# Patient Record
Sex: Male | Born: 1970 | Hispanic: No | Marital: Married | Smoking: Never smoker
Health system: Southern US, Community
[De-identification: ages and names within clinical notes are randomized; demographics above are authoritative.]

## PROBLEM LIST (undated history)

## (undated) DIAGNOSIS — L409 Psoriasis, unspecified: Secondary | ICD-10-CM

## (undated) HISTORY — PX: ABDOMINAL SURGERY: SHX537

## (undated) HISTORY — DX: Psoriasis, unspecified: L40.9

---

## 1998-05-07 ENCOUNTER — Emergency Department (HOSPITAL_COMMUNITY): Admission: EM | Admit: 1998-05-07 | Discharge: 1998-05-07 | Payer: Self-pay | Admitting: Emergency Medicine

## 1999-10-02 ENCOUNTER — Encounter: Payer: Self-pay | Admitting: Emergency Medicine

## 1999-10-02 ENCOUNTER — Emergency Department (HOSPITAL_COMMUNITY): Admission: EM | Admit: 1999-10-02 | Discharge: 1999-10-02 | Payer: Self-pay

## 1999-10-30 ENCOUNTER — Emergency Department (HOSPITAL_COMMUNITY): Admission: EM | Admit: 1999-10-30 | Discharge: 1999-10-31 | Payer: Self-pay | Admitting: Emergency Medicine

## 2009-06-01 ENCOUNTER — Encounter: Admission: RE | Admit: 2009-06-01 | Discharge: 2009-06-01 | Payer: Self-pay | Admitting: Internal Medicine

## 2010-02-22 ENCOUNTER — Emergency Department (HOSPITAL_BASED_OUTPATIENT_CLINIC_OR_DEPARTMENT_OTHER)
Admission: EM | Admit: 2010-02-22 | Discharge: 2010-02-22 | Payer: Self-pay | Source: Home / Self Care | Admitting: Emergency Medicine

## 2010-05-10 LAB — DIFFERENTIAL
Basophils Absolute: 0 10*3/uL (ref 0.0–0.1)
Basophils Relative: 0 % (ref 0–1)
Eosinophils Absolute: 0.1 10*3/uL (ref 0.0–0.7)
Eosinophils Relative: 0 % (ref 0–5)
Lymphocytes Relative: 4 % — ABNORMAL LOW (ref 12–46)
Lymphs Abs: 0.6 10*3/uL — ABNORMAL LOW (ref 0.7–4.0)
Monocytes Absolute: 1 10*3/uL (ref 0.1–1.0)
Monocytes Relative: 6 % (ref 3–12)
Neutro Abs: 14.6 10*3/uL — ABNORMAL HIGH (ref 1.7–7.7)
Neutrophils Relative %: 90 % — ABNORMAL HIGH (ref 43–77)

## 2010-05-10 LAB — CBC
HCT: 44.7 % (ref 39.0–52.0)
Hemoglobin: 16 g/dL (ref 13.0–17.0)
MCH: 30.6 pg (ref 26.0–34.0)
MCHC: 35.8 g/dL (ref 30.0–36.0)
MCV: 85.5 fL (ref 78.0–100.0)
Platelets: 208 10*3/uL (ref 150–400)
RBC: 5.23 MIL/uL (ref 4.22–5.81)
RDW: 12.1 % (ref 11.5–15.5)
WBC: 16.2 10*3/uL — ABNORMAL HIGH (ref 4.0–10.5)

## 2010-05-10 LAB — COMPREHENSIVE METABOLIC PANEL
ALT: 40 U/L (ref 0–53)
AST: 40 U/L — ABNORMAL HIGH (ref 0–37)
Albumin: 4.7 g/dL (ref 3.5–5.2)
Alkaline Phosphatase: 105 U/L (ref 39–117)
BUN: 23 mg/dL (ref 6–23)
CO2: 23 mEq/L (ref 19–32)
Calcium: 9.3 mg/dL (ref 8.4–10.5)
Chloride: 106 mEq/L (ref 96–112)
Creatinine, Ser: 0.8 mg/dL (ref 0.4–1.5)
GFR calc Af Amer: >60 mL/min (ref >60)
GFR calc non Af Amer: >60 mL/min (ref >60)
Glucose, Bld: 118 mg/dL — ABNORMAL HIGH (ref 70–99)
Potassium: 3.7 mEq/L (ref 3.5–5.1)
Sodium: 144 mEq/L (ref 135–145)
Total Bilirubin: 1.1 mg/dL (ref 0.3–1.2)
Total Protein: 8.4 g/dL — ABNORMAL HIGH (ref 6.0–8.3)

## 2011-08-09 IMAGING — US US ABDOMEN COMPLETE
1 series · 14 of 25 positions shown · non-contrast
Comparison: None.

CLINICAL DATA: Abnormal LFTs.

ABDOMINAL ULTRASOUND COMPLETE

[Series 1: us abdomen complete · 0.24mm/px · 14 of 71 slices shown]
[im 1/71]
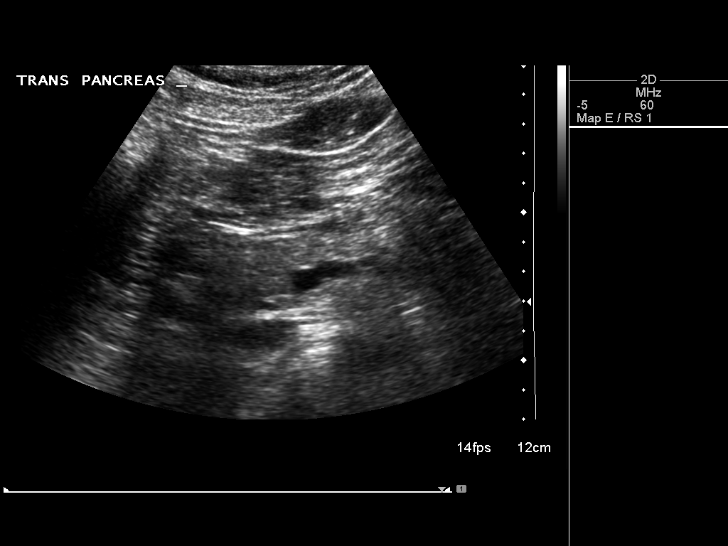
[im 6/71]
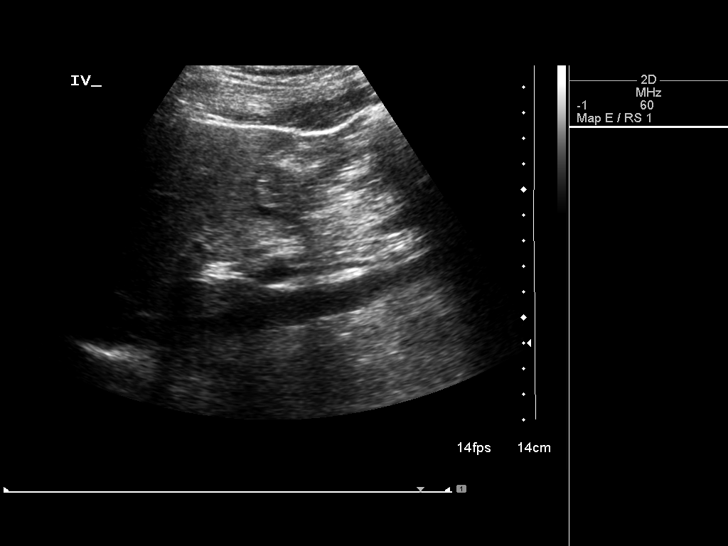
[im 12/71]
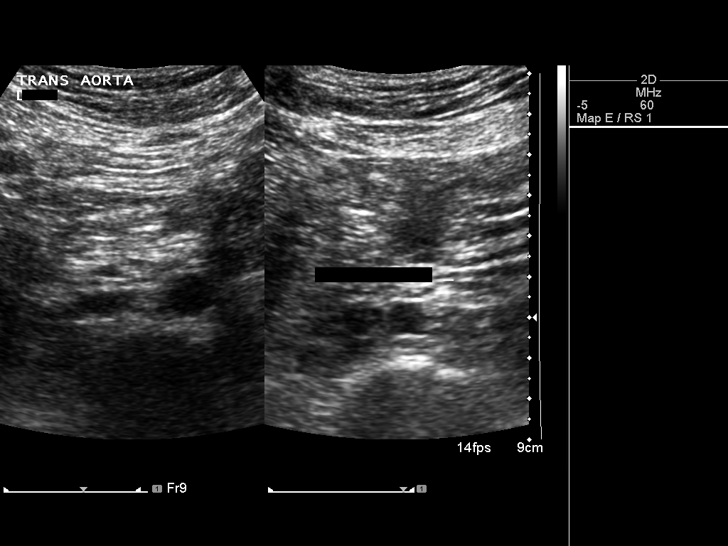
[im 18/71]
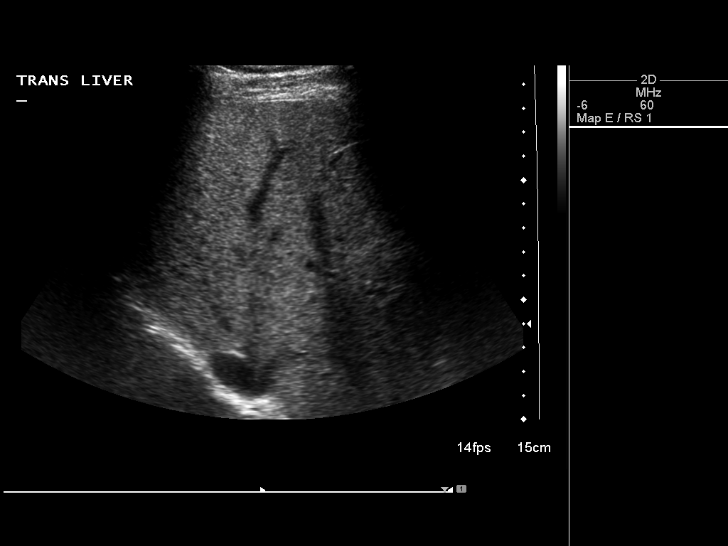
[im 24/71]
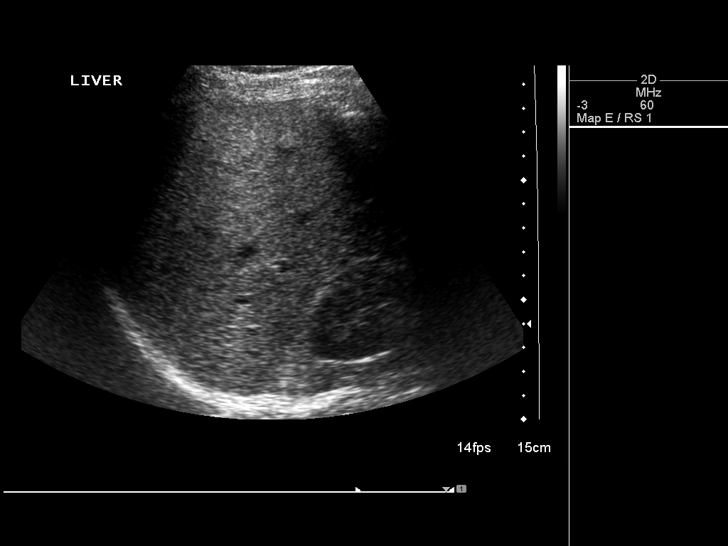
[im 27/71]
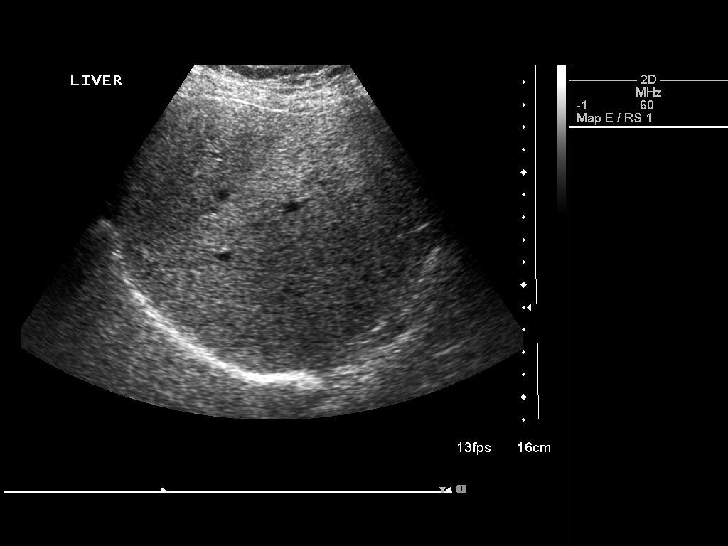
[im 33/71]
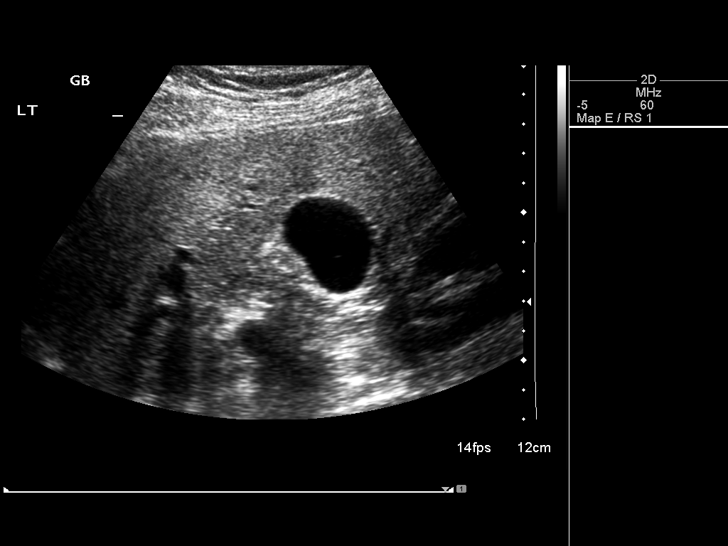
[im 38/71]
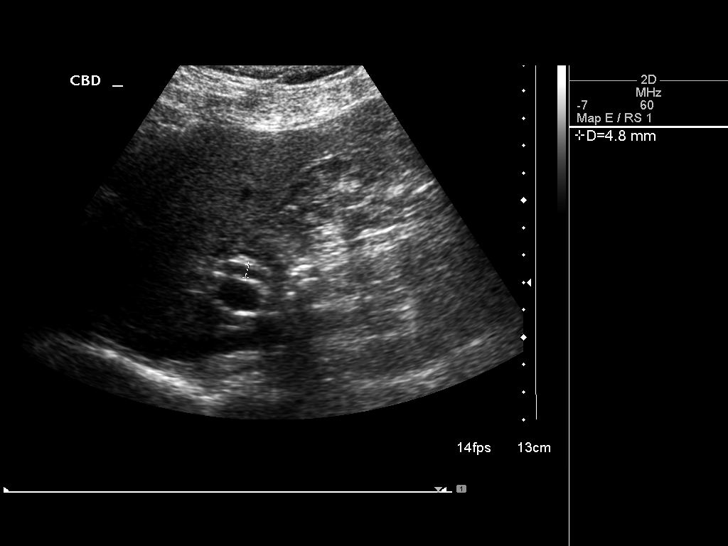
[im 44/71]
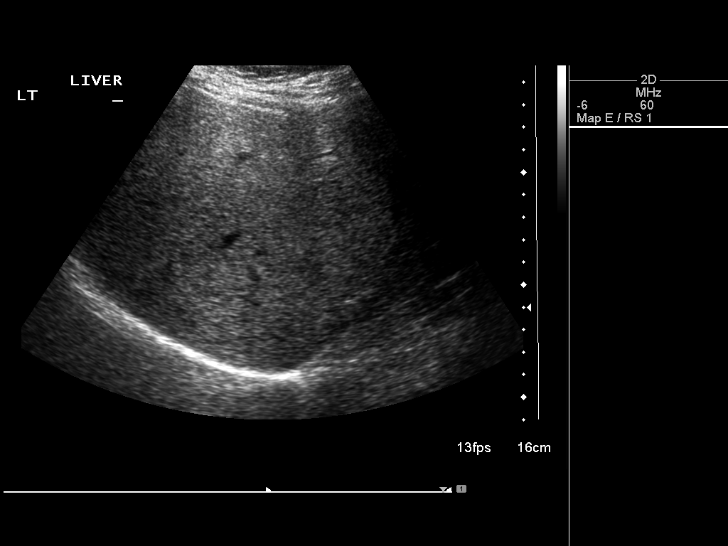
[im 47/71]
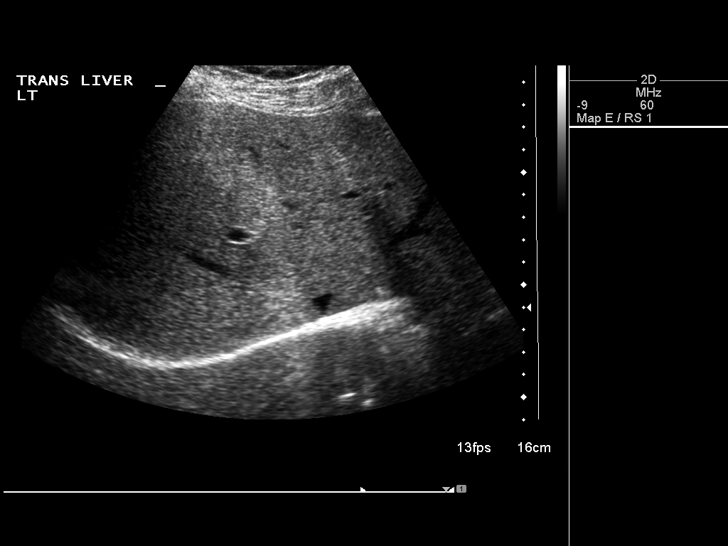
[im 53/71]
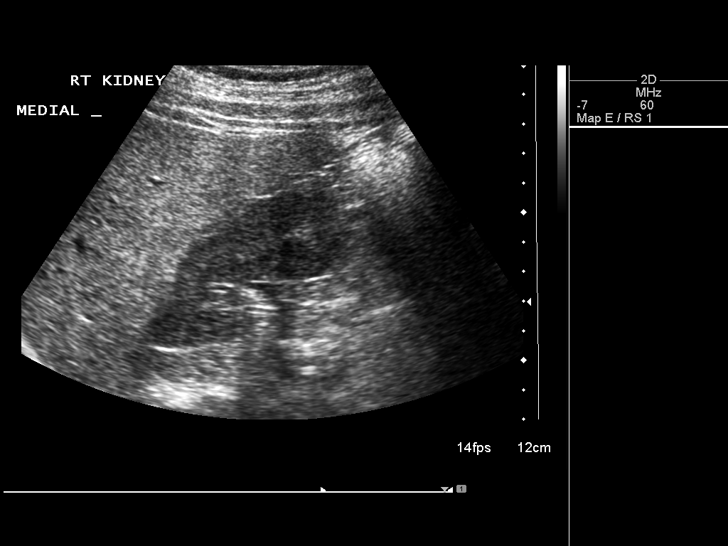
[im 59/71]
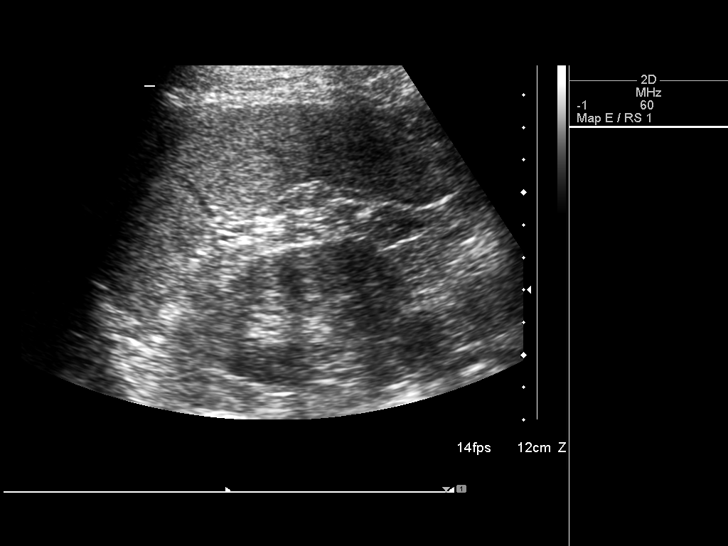
[im 65/71]
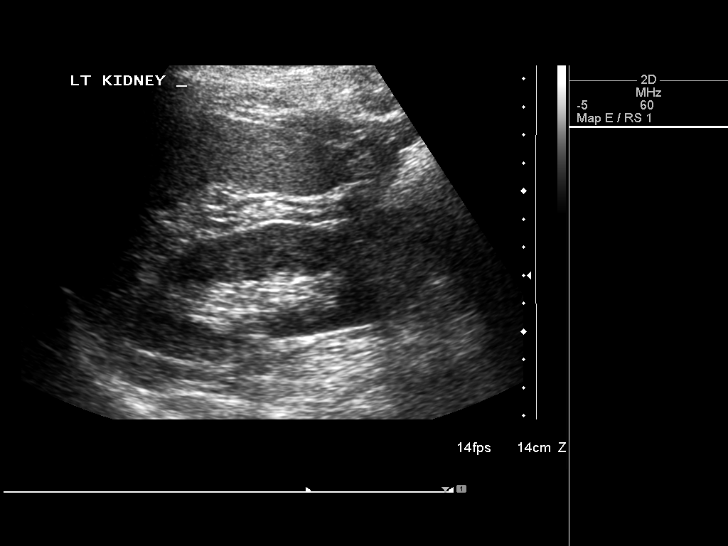
[im 71/71]
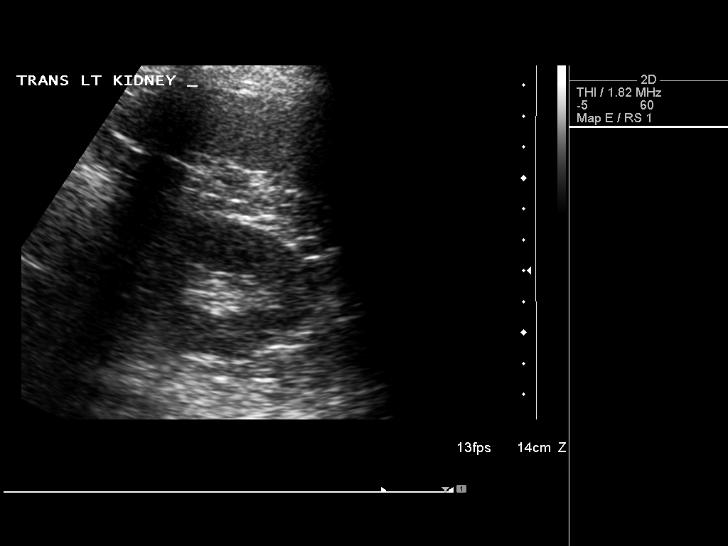

[14 of 25 positions shown; findings below may reference images not displayed]

FINDINGS: Gallbladder:  No gallstones, gallbladder wall thickening, or
pericholecystic fluid.

Common Bile Duct:  Within normal limits in caliber. Common duct
diameter is 4.8 mm.

Liver:  No focal mass lesion identified.  Subtle heterogeneity of
the hepatic echotexture may be associated with diffuse
hepatocellular disease.

IVC:  Appears normal.

Pancreas:  Limited visualization of the pancreas due to overlying
bowel gas.

Spleen:  Within normal limits in size and echotexture. 10.0 cm
splenic length.

Right kidney:  Normal in size (10.2 cm length) and parenchymal
echogenicity.  No evidence of mass or hydronephrosis.

Left kidney:  Normal in size (10.3 cm length) and parenchymal
echogenicity.  No evidence of mass or hydronephrosis.

Abdominal Aorta:  No aneurysm identified. 1.9 cm maximum diameter.
IMPRESSION: Normal gallbladder.  No biliary ductal dilatation.  Suspicion for
mild diffuse hepatocellular disease.

## 2012-05-01 IMAGING — CR DG ABDOMEN ACUTE W/ 1V CHEST
3 series · 3 of 3 positions shown · non-contrast
Comparison: None.

CLINICAL DATA: Abdominal pain.

ACUTE ABDOMEN SERIES (ABDOMEN 2 VIEW & CHEST 1 VIEW)

[w chest pa]
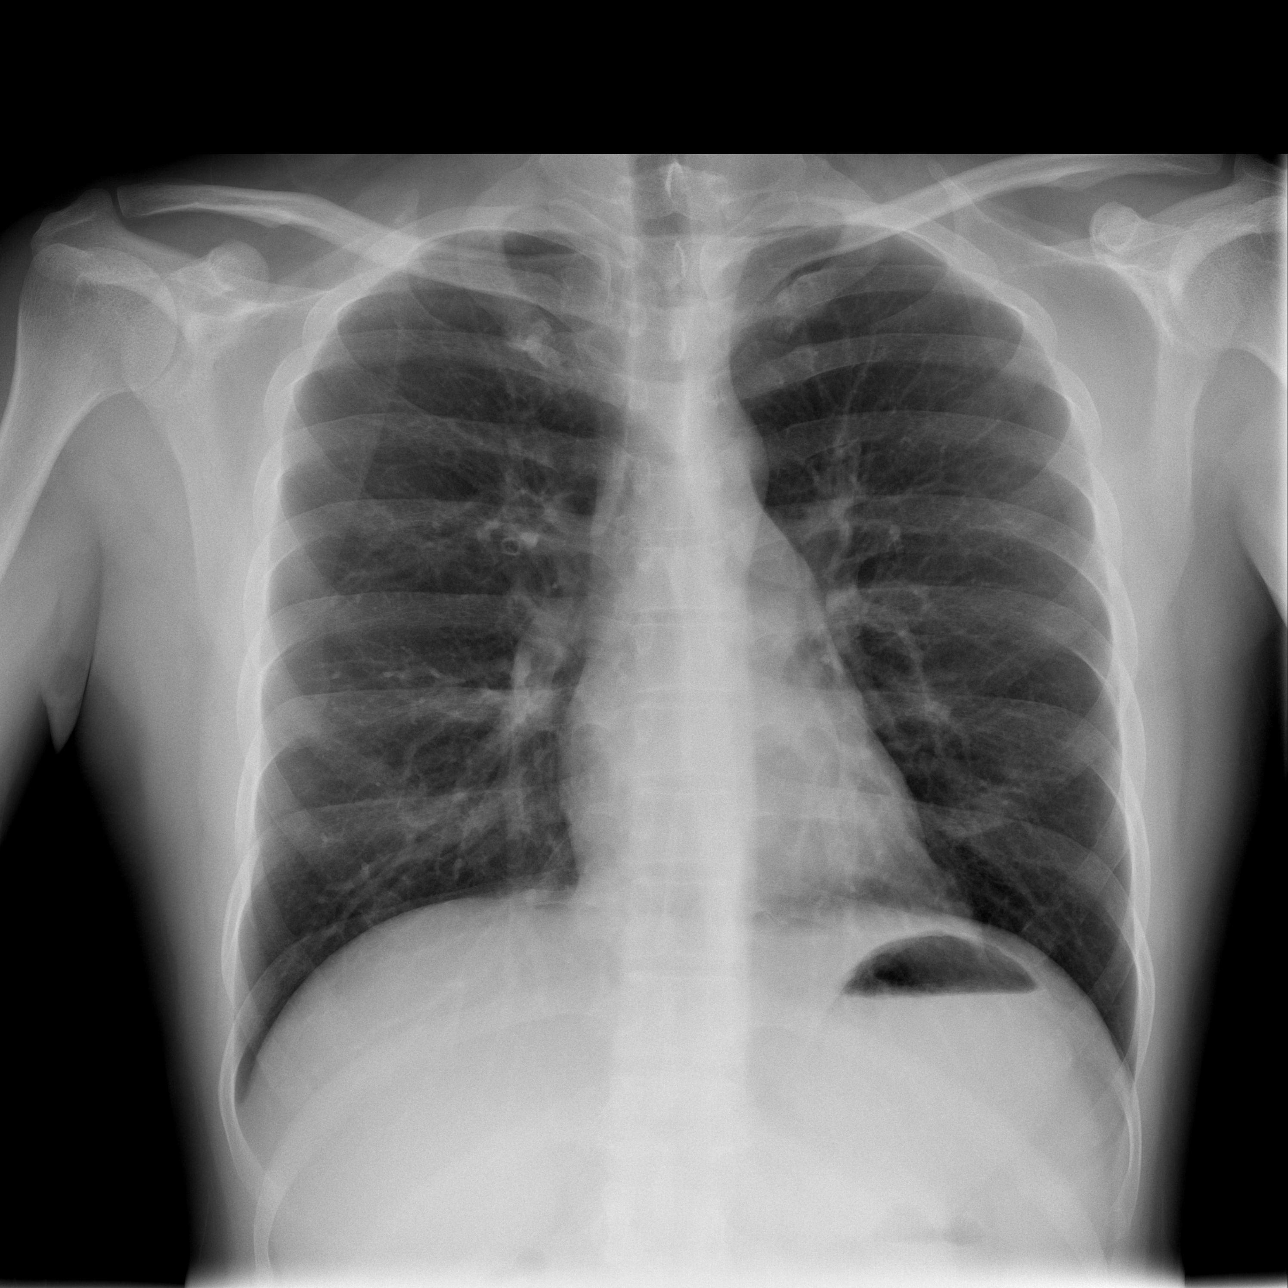

[w abdomen upright]
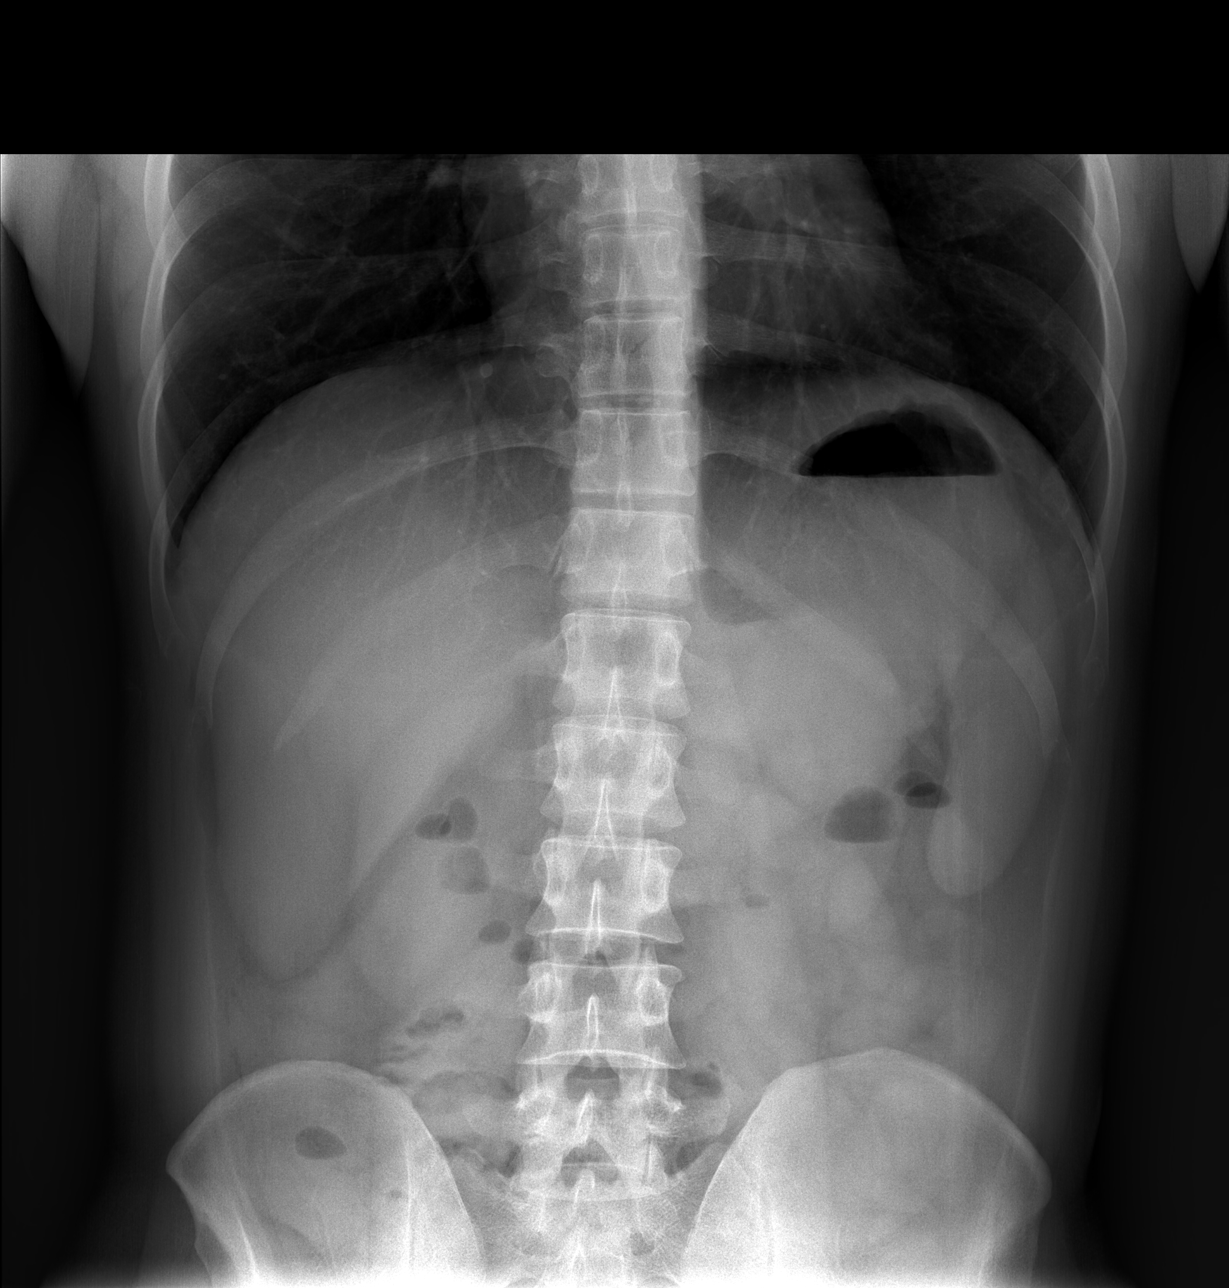

[t abdomen supine]
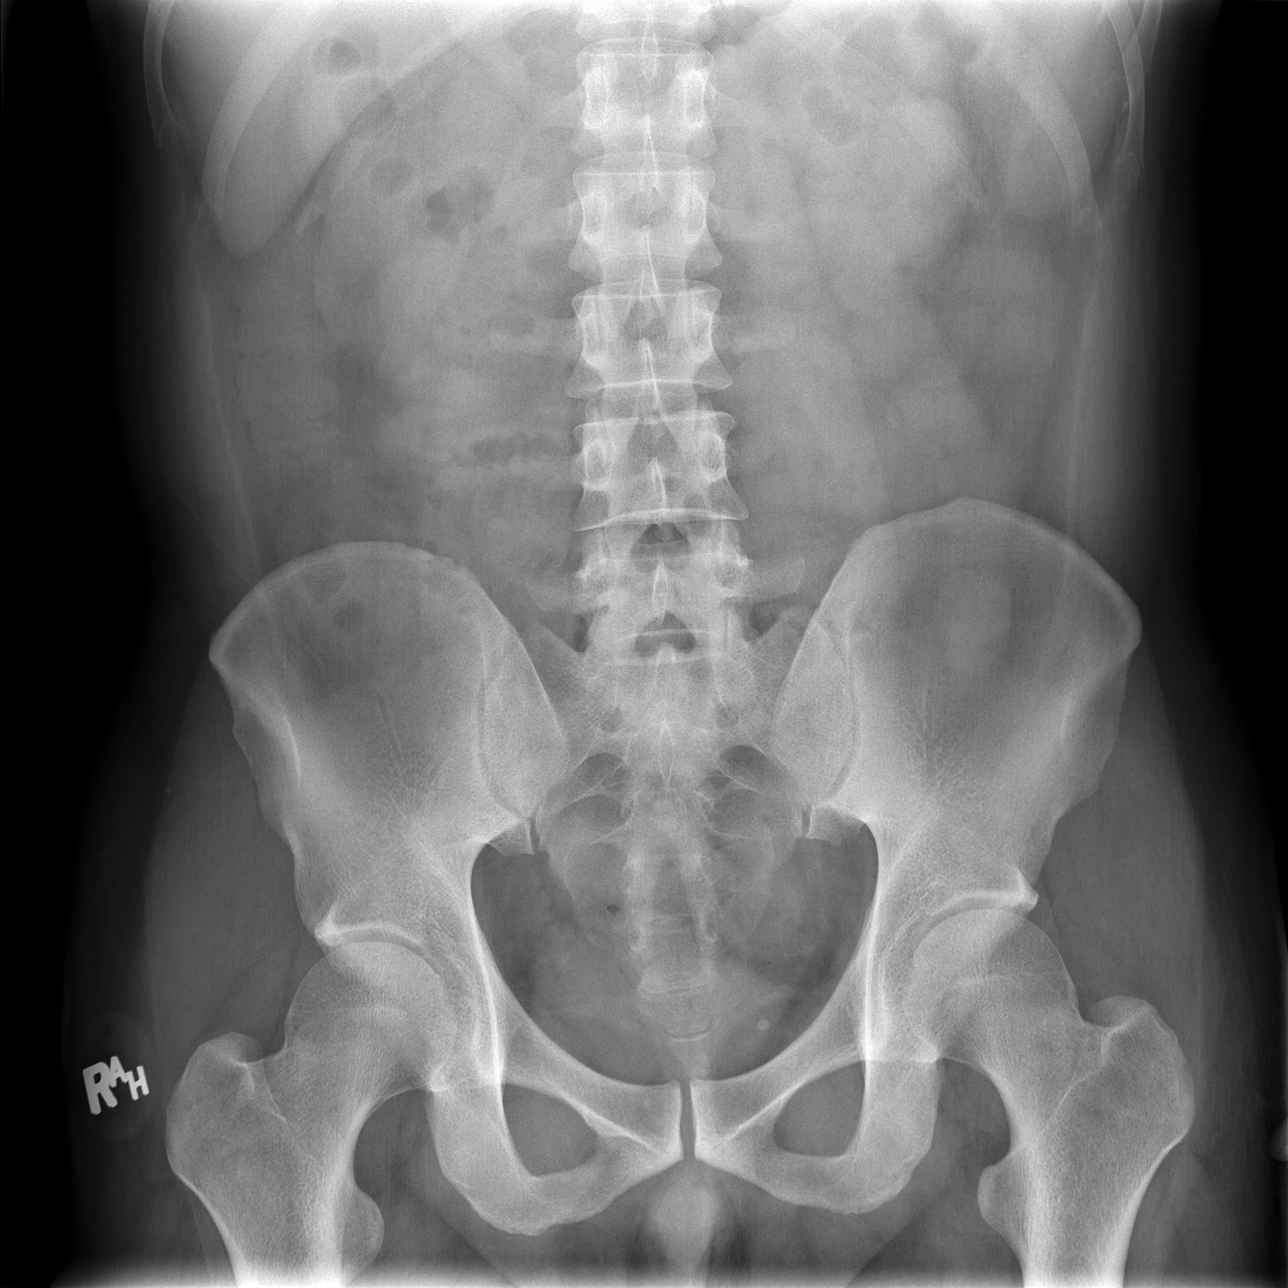

[3 of 3 positions shown; findings below may reference images not displayed]

FINDINGS: Single view of the chest demonstrates clear lungs and
normal heart size.  No pneumothorax or pleural effusion.

Two views of the abdomen show no free intraperitoneal air.  Short
air fluid levels are noted in the colon.  No small bowel
distention.
IMPRESSION: Short air fluid levels in the colon compatible with the presence of
liquid stool.  Negative for bowel obstruction.

## 2016-02-29 DIAGNOSIS — Z0000 Encounter for general adult medical examination without abnormal findings: Secondary | ICD-10-CM | POA: Diagnosis not present

## 2016-03-11 DIAGNOSIS — S63521A Sprain of radiocarpal joint of right wrist, initial encounter: Secondary | ICD-10-CM | POA: Diagnosis not present

## 2016-03-29 DIAGNOSIS — R05 Cough: Secondary | ICD-10-CM | POA: Diagnosis not present

## 2016-03-29 DIAGNOSIS — J069 Acute upper respiratory infection, unspecified: Secondary | ICD-10-CM | POA: Diagnosis not present

## 2016-04-27 DIAGNOSIS — E039 Hypothyroidism, unspecified: Secondary | ICD-10-CM | POA: Diagnosis not present

## 2016-04-27 DIAGNOSIS — Z0000 Encounter for general adult medical examination without abnormal findings: Secondary | ICD-10-CM | POA: Diagnosis not present

## 2016-04-27 DIAGNOSIS — L409 Psoriasis, unspecified: Secondary | ICD-10-CM | POA: Diagnosis not present

## 2016-04-27 DIAGNOSIS — Z131 Encounter for screening for diabetes mellitus: Secondary | ICD-10-CM | POA: Diagnosis not present

## 2016-05-04 DIAGNOSIS — L409 Psoriasis, unspecified: Secondary | ICD-10-CM | POA: Diagnosis not present

## 2016-05-04 DIAGNOSIS — K760 Fatty (change of) liver, not elsewhere classified: Secondary | ICD-10-CM | POA: Diagnosis not present

## 2016-05-04 DIAGNOSIS — Z0000 Encounter for general adult medical examination without abnormal findings: Secondary | ICD-10-CM | POA: Diagnosis not present

## 2016-07-06 DIAGNOSIS — J069 Acute upper respiratory infection, unspecified: Secondary | ICD-10-CM | POA: Diagnosis not present

## 2016-11-09 DIAGNOSIS — K760 Fatty (change of) liver, not elsewhere classified: Secondary | ICD-10-CM | POA: Diagnosis not present

## 2016-11-09 DIAGNOSIS — E039 Hypothyroidism, unspecified: Secondary | ICD-10-CM | POA: Diagnosis not present

## 2016-11-29 DIAGNOSIS — Z23 Encounter for immunization: Secondary | ICD-10-CM | POA: Diagnosis not present

## 2017-01-04 DIAGNOSIS — S2231XA Fracture of one rib, right side, initial encounter for closed fracture: Secondary | ICD-10-CM | POA: Diagnosis not present

## 2017-01-04 DIAGNOSIS — R0781 Pleurodynia: Secondary | ICD-10-CM | POA: Diagnosis not present

## 2017-02-15 DIAGNOSIS — R0781 Pleurodynia: Secondary | ICD-10-CM | POA: Diagnosis not present

## 2017-02-17 DIAGNOSIS — H43393 Other vitreous opacities, bilateral: Secondary | ICD-10-CM | POA: Diagnosis not present

## 2017-02-17 DIAGNOSIS — H2513 Age-related nuclear cataract, bilateral: Secondary | ICD-10-CM | POA: Diagnosis not present

## 2017-05-01 ENCOUNTER — Encounter: Payer: Self-pay | Admitting: Rheumatology

## 2017-05-01 DIAGNOSIS — K760 Fatty (change of) liver, not elsewhere classified: Secondary | ICD-10-CM | POA: Diagnosis not present

## 2017-05-01 DIAGNOSIS — E039 Hypothyroidism, unspecified: Secondary | ICD-10-CM | POA: Diagnosis not present

## 2017-05-01 DIAGNOSIS — Z0000 Encounter for general adult medical examination without abnormal findings: Secondary | ICD-10-CM | POA: Diagnosis not present

## 2017-05-08 DIAGNOSIS — Z0000 Encounter for general adult medical examination without abnormal findings: Secondary | ICD-10-CM | POA: Diagnosis not present

## 2017-05-08 DIAGNOSIS — Z125 Encounter for screening for malignant neoplasm of prostate: Secondary | ICD-10-CM | POA: Diagnosis not present

## 2017-05-08 DIAGNOSIS — K760 Fatty (change of) liver, not elsewhere classified: Secondary | ICD-10-CM | POA: Diagnosis not present

## 2017-05-08 DIAGNOSIS — L409 Psoriasis, unspecified: Secondary | ICD-10-CM | POA: Diagnosis not present

## 2017-06-21 DIAGNOSIS — R05 Cough: Secondary | ICD-10-CM | POA: Diagnosis not present

## 2017-06-21 DIAGNOSIS — J302 Other seasonal allergic rhinitis: Secondary | ICD-10-CM | POA: Diagnosis not present

## 2017-10-09 DIAGNOSIS — S161XXA Strain of muscle, fascia and tendon at neck level, initial encounter: Secondary | ICD-10-CM | POA: Diagnosis not present

## 2018-01-29 NOTE — Progress Notes (Signed)
Office Visit Note  Patient: Edward Fitzgerald             Date of Birth: 1970/04/07           MRN: [id number]             PCP: Merrilee Seashore, MD Referring: Merrilee Seashore, MD Visit Date: 02/08/2018 Occupation: Corporate investment banker  Subjective:  Psorisis.   History of Present Illness: Edward Fitzgerald is a 47 y.o. male seen in consultation per request of his PCP.  According to patient he started having psoriasis about 10 years ago.  The psoriasis gradually spread over the last for 5 years.  He states he is seen dermatologist who offered only topical agents and none of those were helpful.  He was also advised some subcu medication and he did not like the side effects.  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  He does not have any joint pain or joint swelling.  He denies any history of uveitis, plantar fasciitis or Achilles tendinitis.  He has been using Taclonex which has not been helpful.  Activities of Daily Living:  Patient reports morning stiffness for 0 minute.   Patient Denies nocturnal pain.  Difficulty dressing/grooming: Denies Difficulty climbing stairs: Denies Difficulty getting out of chair: Denies Difficulty using hands for taps, buttons, cutlery, and/or writing: Denies  Review of Systems  Constitutional: Negative for fatigue and night sweats.  HENT: Negative for mouth sores, mouth dryness and nose dryness.   Eyes: Negative for redness and dryness.  Respiratory: Negative for shortness of breath and difficulty breathing.   Cardiovascular: Negative for chest pain, palpitations, hypertension, irregular heartbeat and swelling in legs/feet.  Gastrointestinal: Negative for constipation and diarrhea.  Endocrine: Negative for increased urination.  Musculoskeletal: Negative for arthralgias, joint pain, joint swelling, myalgias, muscle weakness, morning stiffness, muscle tenderness and myalgias.  Skin: Positive for rash. Negative for color change, hair loss, nodules/bumps,  skin tightness, ulcers and sensitivity to sunlight.  Allergic/Immunologic: Negative for susceptible to infections.  Neurological: Negative for dizziness, fainting, memory loss, night sweats and weakness ( ).  Hematological: Negative for swollen glands.  Psychiatric/Behavioral: Negative for depressed mood and sleep disturbance. The patient is nervous/anxious.     PMFS History:  Patient Active Problem List   Diagnosis Date Noted  . Subclinical hypothyroidism 02/08/2018  . NAFLD (nonalcoholic fatty liver disease) 02/08/2018  . Psoriasis 02/08/2018    History reviewed. No pertinent past medical history.  Family History  Problem Relation Age of Onset  . Healthy Mother   . Healthy Father   . Healthy Brother   . Healthy Son   . Healthy Daughter    Past Surgical History:  Procedure Laterality Date  . ABDOMINAL SURGERY     Social History   Social History Narrative  . Not on file    Objective: Vital Signs: BP 118/78 (BP Location: Right Arm, Patient Position: Sitting, Cuff Size: Normal)   Pulse 73   Resp 13   Ht '5\' 7"'$  (1.702 m)   Wt 162 lb 3.2 oz (73.6 kg)   BMI 25.40 kg/m    Physical Exam Vitals signs and nursing note reviewed.  Constitutional:      Appearance: He is well-developed.  HENT:     Head: Normocephalic and atraumatic.  Eyes:     Conjunctiva/sclera: Conjunctivae normal.     Pupils: Pupils are equal, round, and reactive to light.  Neck:     Musculoskeletal: Normal range of motion and neck supple.  Cardiovascular:     Rate and Rhythm: Normal rate and regular rhythm.     Heart sounds: Normal heart sounds.  Pulmonary:     Effort: Pulmonary effort is normal.     Breath sounds: Normal breath sounds.  Abdominal:     General: Bowel sounds are normal.     Palpations: Abdomen is soft.  Skin:    General: Skin is warm and dry.     Capillary Refill: Capillary refill takes less than 2 seconds.     Findings: Rash present.     Comments: Plaque psoriasis patches noted  on the scalp, trunk and extremities.  Neurological:     Mental Status: He is alert and oriented to person, place, and time.  Psychiatric:        Behavior: Behavior normal.      Musculoskeletal Exam: C-spine thoracic lumbar spine good range of motion.  Shoulder joints, elbow joints, wrist joints, MCPs and PIPs and DIPs were in good range of motion.  No synovitis was noted on examination.  Hip joints, knee joints, ankles, MTPs PIPs and DIPs with good range of motion with no synovitis.  CDAI Exam: CDAI Score: Not documented Patient Global Assessment: Not documented; Provider Global Assessment: Not documented Swollen: Not documented; Tender: Not documented Joint Exam   Not documented   There is currently no information documented on the homunculus. Go to the Rheumatology activity and complete the homunculus joint exam.  Investigation: No additional findings.  Imaging: No results found.  Recent Labs: Lab Results  Component Value Date   WBC 16.2 (H) 02/22/2010   HGB 16.0 02/22/2010   PLT 208 02/22/2010   NA 144 02/22/2010   K 3.7 02/22/2010   CL 106 02/22/2010   CO2 23 02/22/2010   GLUCOSE 118 (H) 02/22/2010   BUN 23 02/22/2010   CREATININE 0.8 02/22/2010   BILITOT 1.1 02/22/2010   ALKPHOS 105 02/22/2010   AST 40 (H) 02/22/2010   ALT 40 02/22/2010   PROT 8.4 (H) 02/22/2010   ALBUMIN 4.7 02/22/2010   CALCIUM 9.3 02/22/2010   GFRAA  02/22/2010    >60        The eGFR has been calculated using the MDRD equation. This calculation has not been validated in all clinical situations. eGFR's persistently <60 mL/min signify possible Chronic Kidney Disease.   May 01, 2017 CBC normal, CMP normal at his PCPs office Speciality Comments: No specialty comments available.  Procedures:  No procedures performed Allergies: Patient has no known allergies.   Assessment / Plan:     Visit Diagnoses: Plaque psoriasis-patient has extensive psoriasis covering almost all of his body.   He has psoriasis on his scalp, face, chest, trunk, extremities.  He has tried topical agents without good results.  He does not want to take methotrexate because of history of fatty liver and elevated LFTs in the past.  We had detailed discussion regarding different treatment options.  He is concerned about the subcu medications and their side effects.  We discussed possible use of Otezla.  He is more inclined towards going on [place].  Indications side effects were discussed.  We will apply for [facility].  Patient is concerned about the long-term side effects of all the medications and long-term use of medications.  We had detailed discussion about the medication use and the chronic nature of the disease process.  High risk prescription-I will obtain baseline labs today in case we have to start him on any other immunosuppressive agents.  I  will await approval from insurance company at this time.  Subclinical hypothyroidism  NAFLD (nonalcoholic fatty liver disease)   Orders: Orders Placed This Encounter  Procedures  . CBC with Differential/Platelet  . COMPLETE METABOLIC PANEL WITH GFR  . Hepatitis B core antibody, IgM  . Hepatitis B surface antigen  . Hepatitis C antibody  . HIV Antibody (routine testing w rflx)  . QuantiFERON-TB Gold Plus  . Serum protein electrophoresis with reflex  . IgG, IgA, IgM   No orders of the defined types were placed in this encounter.   Face-to-face time spent with patient was 40 minutes. Greater than 50% of time was spent in counseling and coordination of care.  Follow-Up Instructions: Return for Psoriasis.   Bo Merino, MD  Note - This record has been created using Editor, commissioning.  Chart creation errors have been sought, but may not always  have been located. Such creation errors do not reflect on  the standard of medical care.

## 2018-02-08 ENCOUNTER — Encounter: Payer: Self-pay | Admitting: Rheumatology

## 2018-02-08 ENCOUNTER — Telehealth: Payer: Self-pay | Admitting: Pharmacist

## 2018-02-08 ENCOUNTER — Ambulatory Visit (INDEPENDENT_AMBULATORY_CARE_PROVIDER_SITE_OTHER): Payer: 59 | Admitting: Rheumatology

## 2018-02-08 ENCOUNTER — Other Ambulatory Visit: Payer: Self-pay | Admitting: Rheumatology

## 2018-02-08 VITALS — BP 118/78 | HR 73 | Resp 13 | Ht 67.0 in | Wt 162.2 lb

## 2018-02-08 DIAGNOSIS — L409 Psoriasis, unspecified: Secondary | ICD-10-CM | POA: Insufficient documentation

## 2018-02-08 DIAGNOSIS — K760 Fatty (change of) liver, not elsewhere classified: Secondary | ICD-10-CM

## 2018-02-08 DIAGNOSIS — L400 Psoriasis vulgaris: Secondary | ICD-10-CM | POA: Diagnosis not present

## 2018-02-08 DIAGNOSIS — E038 Other specified hypothyroidism: Secondary | ICD-10-CM

## 2018-02-08 DIAGNOSIS — E039 Hypothyroidism, unspecified: Secondary | ICD-10-CM

## 2018-02-08 DIAGNOSIS — Z79899 Other long term (current) drug therapy: Secondary | ICD-10-CM | POA: Diagnosis not present

## 2018-02-08 NOTE — Progress Notes (Signed)
Pharmacy Note  Subjective:  Patient presents today to the [facility] to see Dr. Estanislado Pandy.  Patient was seen by the pharmacist for counseling on [place] for plaque psoriasis.  He is using topical therapy. He has fatty liver disease so will avoid methotrexate.  Objective: CMP     Component Value Date/Time   NA 144 02/22/2010 0328   K 3.7 02/22/2010 0328   CL 106 02/22/2010 0328   CO2 23 02/22/2010 0328   GLUCOSE 118 (H) 02/22/2010 0328   BUN 23 02/22/2010 0328   CREATININE 0.8 02/22/2010 0328   CALCIUM 9.3 02/22/2010 0328   PROT 8.4 (H) 02/22/2010 0328   ALBUMIN 4.7 02/22/2010 0328   AST 40 (H) 02/22/2010 0328   ALT 40 02/22/2010 0328   ALKPHOS 105 02/22/2010 0328   BILITOT 1.1 02/22/2010 0328   GFRNONAA >60 02/22/2010 0328   GFRAA  02/22/2010 0328    >60        The eGFR has been calculated using the MDRD equation. This calculation has not been validated in all clinical situations. eGFR's persistently <60 mL/min signify possible Chronic Kidney Disease.    Assessment/Plan:  Counseled patient that [place] Nail is a PDE 4 inhibitor that works to treat psoriasis.  Counseled patient on purpose, proper use, and adverse effects of Otezla.  Reviewed the most common adverse effects of weight loss, depression, nausea/diarrhea/vomiting, headaches, and nasal congestion.  Provided patient with medication education material and answered all questions.  Patient consented to [place]. Will send document to scan center. Will start approval process for [facility].    All questions encouraged and answered.  Instructed to call with any further questions or concerns.  Mariella Saa, PharmD, [facility] Rheumatology Clinical Pharmacist  02/08/2018 8:57 AM

## 2018-02-08 NOTE — Addendum Note (Signed)
Addended by: Verlin FesterYOPP, AMBER C on: 02/08/2018 11:28 AM   Modules accepted: Orders

## 2018-02-08 NOTE — Telephone Encounter (Signed)
Received a fax from [employer] regarding a prior authorization [facility]. Authorization has been APPROVED from 02/15/18 to 02/09/19.   Will send document to scan center.  Authorization # [id number] Phone # [phone]  9:34 AM Dorthula Nettlesachael N Taneal Sonntag, CPhT

## 2018-02-08 NOTE — Telephone Encounter (Signed)
Prescription pending CBC/CMP results.  Patient may come in and pick up Otezla starter pack which includes copay card.

## 2018-02-08 NOTE — Telephone Encounter (Signed)
Received a Prior Authorization request from [employer] for [place]. Authorization has been submitted to patient's insurance via Cover My Meds. Will update once we receive a response.

## 2018-02-08 NOTE — Telephone Encounter (Signed)
Please start [facility] investigation [facility]  He has only tried topical agents and has fatty liver disease so would not like to start MTX.  Thank you!

## 2018-02-08 NOTE — Telephone Encounter (Signed)
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

## 2018-02-13 LAB — COMPLETE METABOLIC PANEL WITH GFR
AG Ratio: 1.6 (calc) (ref 1.0–2.5)
ALKALINE PHOSPHATASE (APISO): 93 U/L (ref 40–115)
ALT: 26 U/L (ref 9–46)
AST: 24 U/L (ref 10–40)
Albumin: 4.9 g/dL (ref 3.6–5.1)
BUN: 21 mg/dL (ref 7–25)
CO2: 26 mmol/L (ref 20–32)
CREATININE: 1.03 mg/dL (ref 0.60–1.35)
Calcium: 10.2 mg/dL (ref 8.6–10.3)
Chloride: 104 mmol/L (ref 98–110)
GFR, Est African American: 101 mL/min/{1.73_m2} (ref >=60)
GFR, Est Non African American: 87 mL/min/{1.73_m2} (ref >=60)
GLOBULIN: 3.1 g/dL (ref 1.9–3.7)
Glucose, Bld: 93 mg/dL (ref 65–99)
Potassium: 4.1 mmol/L (ref 3.5–5.3)
SODIUM: 141 mmol/L (ref 135–146)
Total Bilirubin: 0.8 mg/dL (ref 0.2–1.2)
Total Protein: 8 g/dL (ref 6.1–8.1)

## 2018-02-13 LAB — CBC WITH DIFFERENTIAL/PLATELET
ABSOLUTE MONOCYTES: 489 {cells}/uL (ref 200–950)
Basophils Absolute: 80 cells/uL (ref 0–200)
Basophils Relative: 1.1 %
EOS ABS: 161 {cells}/uL (ref 15–500)
Eosinophils Relative: 2.2 %
HCT: 48.4 % (ref 38.5–50.0)
Hemoglobin: 16.7 g/dL (ref 13.2–17.1)
Lymphs Abs: 1621 cells/uL (ref 850–3900)
MCH: 30.7 pg (ref 27.0–33.0)
MCHC: 34.5 g/dL (ref 32.0–36.0)
MCV: 89 fL (ref 80.0–100.0)
MPV: 10.2 fL (ref 7.5–12.5)
Monocytes Relative: 6.7 %
NEUTROS PCT: 67.8 %
Neutro Abs: 4949 cells/uL (ref 1500–7800)
PLATELETS: 295 10*3/uL (ref 140–400)
RBC: 5.44 10*6/uL (ref 4.20–5.80)
RDW: 12.7 % (ref 11.0–15.0)
TOTAL LYMPHOCYTE: 22.2 %
WBC: 7.3 10*3/uL (ref 3.8–10.8)

## 2018-02-13 LAB — HIV ANTIBODY (ROUTINE TESTING W REFLEX): HIV 1&2 Ab, 4th Generation: NONREACTIVE

## 2018-02-13 LAB — PROTEIN ELECTROPHORESIS, SERUM, WITH REFLEX
ALPHA 1: 0.3 g/dL (ref 0.2–0.3)
ALPHA 2: 0.7 g/dL (ref 0.5–0.9)
Albumin ELP: 4.8 g/dL (ref 3.8–4.8)
BETA GLOBULIN: 0.5 g/dL (ref 0.4–0.6)
Beta 2: 0.6 g/dL — ABNORMAL HIGH (ref 0.2–0.5)
GAMMA GLOBULIN: 1.3 g/dL (ref 0.8–1.7)
TOTAL PROTEIN: 8.1 g/dL (ref 6.1–8.1)

## 2018-02-13 LAB — IGG, IGA, IGM
IMMUNOGLOBULIN A: 384 mg/dL — AB (ref 47–310)
IgG (Immunoglobin G), Serum: 1353 mg/dL (ref 600–1640)
IgM, Serum: 96 mg/dL (ref 50–300)

## 2018-02-13 LAB — HEPATITIS B SURFACE ANTIGEN: Hepatitis B Surface Ag: NONREACTIVE

## 2018-02-13 LAB — HEPATITIS B CORE ANTIBODY, IGM: Hep B C IgM: NONREACTIVE

## 2018-02-13 LAB — QUANTIFERON-TB GOLD PLUS
NIL: 0.02 [IU]/mL
QuantiFERON-TB Gold Plus: NEGATIVE
TB1-NIL: 0 IU/mL
TB2-NIL: 0.01 [IU]/mL

## 2018-02-13 LAB — HEPATITIS C ANTIBODY
Hepatitis C Ab: NONREACTIVE
SIGNAL TO CUT-OFF: 0.08 (ref <1.00)

## 2018-02-13 LAB — IFE INTERPRETATION: Immunofix Electr Int: NOT DETECTED

## 2018-02-14 ENCOUNTER — Telehealth: Payer: Self-pay | Admitting: Pharmacist

## 2018-02-14 DIAGNOSIS — L400 Psoriasis vulgaris: Secondary | ICD-10-CM

## 2018-02-14 MED ORDER — APREMILAST 30 MG PO TABS: 30.0000 mg | ORAL_TABLET | Freq: Two times a day (BID) | ORAL | 0 refills | Status: DC

## 2018-02-14 NOTE — Progress Notes (Signed)
All the labs are within normal limits.  If patient is ready to start on [place] we can apply for it.

## 2018-02-14 NOTE — Telephone Encounter (Signed)
Informed patient of results and we can start [place].  Patient would like to proceed with [place].  Instructed patient to come to the office for starter pack sample which includes a co-pay card.  Patient verbalized understanding.  Patient must use [facility] pharmacy per insurance.  Sent script to pharmacy.

## 2018-02-14 NOTE — Telephone Encounter (Signed)
-----   Message from Pollyann SavoyShaili Deveshwar, MD sent at 02/14/2018  8:36 AM EST ----- All the labs are within normal limits.  If patient is ready to start on [place] we can apply for it.

## 2018-02-14 NOTE — Telephone Encounter (Signed)
Patient must use [facility]

## 2018-02-15 MED ORDER — APREMILAST 10 & 20 & 30 MG PO TBPK: ORAL_TABLET | ORAL | Status: DC

## 2018-02-15 MED ORDER — APREMILAST 30 MG PO TABS: 30.0000 mg | ORAL_TABLET | Freq: Two times a day (BID) | ORAL | 0 refills | Status: DC

## 2018-02-15 NOTE — Telephone Encounter (Signed)
Medication Samples have been provided to the patient.  Drug name: Otezla starter pack Qty:1   LOT:  [id number]  Exp.Date: 2/20  Dosing instructions: Follow directions for taper on pack.  The patient has been instructed regarding the correct time, dose, and frequency of taking this medication, including desired effects and most common side effects.   [facility]  All questions encouraged and answered.  Instructed patient to call with any further questions or concerns.  Verlin FesterAmber Jelani Vreeland, PharmD, BCACP Rheumatology Clinical Pharmacist  02/15/2018 2:02 PM

## 2018-02-15 NOTE — Addendum Note (Signed)
Addended by: Verlin FesterYOPP, Ryna Beckstrom C on: 02/15/2018 02:05 PM   Modules accepted: Orders

## 2018-02-26 NOTE — Telephone Encounter (Signed)
Called patient to follow up after starting Otezla.  Left voicemail for patient to call with any questions or concerns.

## 2018-03-08 NOTE — Progress Notes (Signed)
Office Visit Note  Patient: Edward Fitzgerald             Date of Birth: Dec 31, 1970           MRN: [id number]             PCP: Georgianne Fick, MD Referring: No ref. provider found Visit Date: 03/16/2018 Occupation: @GUAROCC @  Subjective:  Psoriasis.    History of Present Illness: Edward Fitzgerald is a 48 y.o. male with history of plaque psoriasis.  He has been [place] for 3 weeks and tolerated well except for some loose stools.  He has noticed minimal improvement in skin lesions. He is concerned about certain foods/caffiene interacting with medication.   Activities of Daily Living:  Patient reports morning stiffness for 0 none.   Patient Denies nocturnal pain.  Difficulty dressing/grooming: Denies Difficulty climbing stairs: Denies Difficulty getting out of chair: Denies Difficulty using hands for taps, buttons, cutlery, and/or writing: Denies  Review of Systems  Constitutional: Negative for fatigue and night sweats.  HENT: Positive for nose dryness. Negative for mouth sores and mouth dryness.   Eyes: Negative for redness and dryness.  Respiratory: Negative for shortness of breath and difficulty breathing.   Cardiovascular: Negative for chest pain, palpitations, hypertension, irregular heartbeat and swelling in legs/feet.  Gastrointestinal: Positive for diarrhea. Negative for constipation and nausea.       Mild  Endocrine: Negative for cold intolerance and increased urination.  Genitourinary: Negative for difficulty urinating.  Musculoskeletal: Negative for arthralgias, joint pain, joint swelling, myalgias, muscle weakness, morning stiffness, muscle tenderness and myalgias.  Skin: Positive for rash. Negative for color change, hair loss, nodules/bumps, skin tightness, ulcers and sensitivity to sunlight.       Psoriasis  Allergic/Immunologic: Negative for susceptible to infections.  Neurological: Negative for dizziness, fainting, memory loss, night sweats and weakness.  Hematological:  Negative for bruising/bleeding tendency and swollen glands.  Psychiatric/Behavioral: Negative for depressed mood and sleep disturbance. The patient is not nervous/anxious.     PMFS History:  Patient Active Problem List   Diagnosis Date Noted  . Subclinical hypothyroidism 02/08/2018  . NAFLD (nonalcoholic fatty liver disease) 54/65/6812  . Psoriasis 02/08/2018    History reviewed. No pertinent past medical history.  Family History  Problem Relation Age of Onset  . Healthy Mother   . Healthy Father   . Healthy Brother   . Healthy Son   . Healthy Daughter    Past Surgical History:  Procedure Laterality Date  . ABDOMINAL SURGERY     Social History   Social History Narrative  . Not on file    There is no immunization history on file for this patient.   Objective: Vital Signs: BP 105/64 (BP Location: Left Arm, Patient Position: Sitting, Cuff Size: Normal)   Pulse 75   Resp 16   Ht 5\' 7"  (1.702 m)   Wt 163 lb 3.2 oz (74 kg)   BMI 25.56 kg/m    Physical Exam Vitals signs and nursing note reviewed.  Constitutional:      Appearance: He is well-developed.  HENT:     Head: Normocephalic and atraumatic.  Eyes:     Conjunctiva/sclera: Conjunctivae normal.     Pupils: Pupils are equal, round, and reactive to light.  Neck:     Musculoskeletal: Normal range of motion and neck supple.  Cardiovascular:     Rate and Rhythm: Normal rate and regular rhythm.     Heart sounds: Normal heart sounds.  Pulmonary:  Effort: Pulmonary effort is normal.     Breath sounds: Normal breath sounds.  Abdominal:     General: Bowel sounds are normal.     Palpations: Abdomen is soft.  Skin:    General: Skin is warm and dry.     Capillary Refill: Capillary refill takes less than 2 seconds.     Findings: Rash present.     Comments: Extensive psoriasis was noted on trunk and extremities  Neurological:     Mental Status: He is alert and oriented to person, place, and time.  Psychiatric:          Behavior: Behavior normal.      Musculoskeletal Exam: C-spine thoracic lumbar spine was in good range of motion.  He has no SI joint tenderness on examination.  Shoulder joints, elbow joints, wrist joints, MCPs PIPs DIPs with good range of motion with no synovitis.  Hip joints, knee joints, ankles MTPs PIPs been good range of motion.  There was no tendinitis or bursitis on examination.  CDAI Exam: CDAI Score: Not documented Patient Global Assessment: Not documented; Provider Global Assessment: Not documented Swollen: Not documented; Tender: Not documented Joint Exam   Not documented   There is currently no information documented on the homunculus. Go to the Rheumatology activity and complete the homunculus joint exam.  Investigation: No additional findings.  Imaging: No results found.  Recent Labs: Lab Results  Component Value Date   WBC 7.3 02/08/2018   HGB 16.7 02/08/2018   PLT 295 02/08/2018   NA 141 02/08/2018   K 4.1 02/08/2018   CL 104 02/08/2018   CO2 26 02/08/2018   GLUCOSE 93 02/08/2018   BUN 21 02/08/2018   CREATININE 1.03 02/08/2018   BILITOT 0.8 02/08/2018   ALKPHOS 105 02/22/2010   AST 24 02/08/2018   ALT 26 02/08/2018   PROT 8.0 02/08/2018   PROT 8.1 02/08/2018   ALBUMIN 4.7 02/22/2010   CALCIUM 10.2 02/08/2018   GFRAA 101 02/08/2018   QFTBGOLDPLUS NEGATIVE 02/08/2018  February 08, 2018 IFE negative, HIV negative, hepatitis B-, hepatitis C negative, IgA mildly elevated, TB Gold negative  Speciality Comments: No specialty comments available.  Procedures:  No procedures performed Allergies: Patient has no known allergies.   Assessment / Plan:     Visit Diagnoses: Plaque psoriasis - Extensive rash involving trunk and extremities.  Patient has been on [place] for only 3 weeks.  He has been tolerating medication well.  He has not noticed any improvement so far.  Have advised him to give it at least 3 months to notice improvement.  He is not using any  topical agents.  He was given a prescription for clobetasol.- Plan: clobetasol cream (TEMOVATE) 0.05 %.  He was also advised to use Taclonex.  Patient states that he has the prescription at home which she has not been using and will start using.  High risk medication use - Otezla 30 mg p.o. twice daily  NAFLD (nonalcoholic fatty liver disease)  Subclinical hypothyroidism   Orders: No orders of the defined types were placed in this encounter.  Meds ordered this encounter  Medications  . clobetasol cream (TEMOVATE) 0.05 %    Sig: Apply 1 application topically 2 (two) times daily as needed.    Dispense:  60 g    Refill:  0     Follow-Up Instructions: Return in about 3 months (around 06/15/2018) for Psoriasis.   Pollyann Savoy, MD  Note - This record has been created using Dragon  software.  Chart creation errors have been sought, but may not always  have been located. Such creation errors do not reflect on  the standard of medical care.

## 2018-03-16 ENCOUNTER — Encounter: Payer: Self-pay | Admitting: Rheumatology

## 2018-03-16 ENCOUNTER — Ambulatory Visit (INDEPENDENT_AMBULATORY_CARE_PROVIDER_SITE_OTHER): Payer: 59 | Admitting: Rheumatology

## 2018-03-16 VITALS — BP 105/64 | HR 75 | Resp 16 | Ht 67.0 in | Wt 163.2 lb

## 2018-03-16 DIAGNOSIS — E038 Other specified hypothyroidism: Secondary | ICD-10-CM

## 2018-03-16 DIAGNOSIS — E039 Hypothyroidism, unspecified: Secondary | ICD-10-CM

## 2018-03-16 DIAGNOSIS — K760 Fatty (change of) liver, not elsewhere classified: Secondary | ICD-10-CM | POA: Diagnosis not present

## 2018-03-16 DIAGNOSIS — Z79899 Other long term (current) drug therapy: Secondary | ICD-10-CM | POA: Diagnosis not present

## 2018-03-16 DIAGNOSIS — L400 Psoriasis vulgaris: Secondary | ICD-10-CM

## 2018-03-16 MED ORDER — CLOBETASOL PROPIONATE 0.05 % EX CREA: 1.0000 "application " | TOPICAL_CREAM | Freq: Two times a day (BID) | CUTANEOUS | 0 refills | Status: DC | PRN

## 2018-03-19 ENCOUNTER — Telehealth: Payer: Self-pay | Admitting: Pharmacist

## 2018-03-19 DIAGNOSIS — L400 Psoriasis vulgaris: Secondary | ICD-10-CM

## 2018-03-19 MED ORDER — CALCIPOTRIENE-BETAMETH DIPROP 0.005-0.064 % EX OINT: TOPICAL_OINTMENT | Freq: Every day | CUTANEOUS | 1 refills | Status: AC

## 2018-03-19 NOTE — Telephone Encounter (Signed)
Follow up on Taclonex prescription.  Patient states he has solution not ointment. Sent prescription for ointment to [facility] per patient request.  Instructed patient to not use in combination with solution and he may take for up to 3 months in order to notice improvement.  Patient verbalized understanding.  All questions encouraged and answered.  Instructed patient to call with any further questions or concerns.  Verlin Fester, PharmD, [facility] Rheumatology Clinical Pharmacist  03/19/2018 4:29 PM

## 2018-03-30 DIAGNOSIS — J0100 Acute maxillary sinusitis, unspecified: Secondary | ICD-10-CM | POA: Diagnosis not present

## 2018-03-30 DIAGNOSIS — G501 Atypical facial pain: Secondary | ICD-10-CM | POA: Diagnosis not present

## 2018-04-18 ENCOUNTER — Telehealth: Payer: Self-pay | Admitting: Pharmacist

## 2018-04-18 NOTE — Telephone Encounter (Signed)
Received fax from [facility] stating they have failed to reach patient to schedule his [place] shipment. Left message informing patient we received the fax from the pharmacy and wanted to follow up on how he is tolerating his therapy.  Requested return call to my desk at [phone].

## 2018-05-07 DIAGNOSIS — Z0000 Encounter for general adult medical examination without abnormal findings: Secondary | ICD-10-CM | POA: Diagnosis not present

## 2018-05-10 ENCOUNTER — Other Ambulatory Visit: Payer: Self-pay | Admitting: Rheumatology

## 2018-05-10 DIAGNOSIS — L400 Psoriasis vulgaris: Secondary | ICD-10-CM

## 2018-05-10 NOTE — Telephone Encounter (Signed)
Last Visit: 03/16/18 Next Visit: 06/14/18  Okay to refill per Dr. Corliss Skains

## 2018-05-14 DIAGNOSIS — Z0000 Encounter for general adult medical examination without abnormal findings: Secondary | ICD-10-CM | POA: Diagnosis not present

## 2018-05-14 DIAGNOSIS — E039 Hypothyroidism, unspecified: Secondary | ICD-10-CM | POA: Diagnosis not present

## 2018-05-14 DIAGNOSIS — L409 Psoriasis, unspecified: Secondary | ICD-10-CM | POA: Diagnosis not present

## 2018-06-05 NOTE — Progress Notes (Signed)
Virtual Visit via Video Note  I connected with Edward Fitzgerald on 06/05/18 at 11:45 AM EDT by a video enabled telemedicine application and verified that I am speaking with the correct person using two identifiers.   I discussed the limitations of evaluation and management by telemedicine and the availability of in person appointments. The patient expressed understanding and agreed to proceed.   CC: Psoriasis   History of Present Illness: Patient is a 48 year old male with a past medical history plaque psoriasis.  He is on Otezla 30 mg BID.  He uses topical clobetasol and taclonex.   He continues to have psoriasis on his upper extremities, torso, and lower extremities. He has not noticed a noticeable improvement but the psoriasis has not been worsening. He has no joint pain or joint swelling at this time.  He has no morning stiffness.     Review of Systems  Constitutional: Negative for fever and malaise/fatigue.  Eyes: Negative for photophobia, pain, discharge and redness.  Respiratory: Negative for cough, shortness of breath and wheezing.   Cardiovascular: Negative for chest pain and palpitations.  Gastrointestinal: Negative for blood in stool, constipation and diarrhea.  Genitourinary: Negative for dysuria.  Musculoskeletal: Negative for back pain, joint pain, myalgias and neck pain.  Skin: Positive for rash (Psoriasis).  Neurological: Negative for dizziness and headaches.  Psychiatric/Behavioral: Negative for depression. The patient is not nervous/anxious and does not have insomnia.    Observations/Objective:  Physical Exam  Constitutional: He is oriented to person, place, and time and well-developed, well-nourished, and in no distress.  HENT:  Head: Normocephalic and atraumatic.  Eyes: Conjunctivae are normal.  Pulmonary/Chest: Effort normal.  Neurological: He is alert and oriented to person, place, and time.  Psychiatric: Mood, memory, affect and judgment normal.   Patient reports  morning stiffness for 0 minutes.   Patient denies nocturnal pain.  Difficulty dressing/grooming: Denies Difficulty climbing stairs: Denies Difficulty getting out of chair: Denies Difficulty using hands for taps, buttons, cutlery, and/or writing: Denies  Large, extensive patches of psoriasis of bilateral forearms, lower extremities, and torso.   Assessment and Plan: Visit Diagnoses: Plaque psoriasis - He has extensive psoriasis on his torso and upper and lower extremities.  He was started on [place] in January 2020.  He has been taking Otezla 30 mg BID.  He has not noticed any improvement since starting on Otezla.  He continues to use clobetasol and taclonex topically.  He will discontinue [place] due to not noticing any benefit.  We discussed the indications, contraindications, and possible side effects of stelara.  He is apprehensive to switch at this time due to the risk of infection/coronavirus pandemic.  He will read over information about Stelara and notify us when he is ready to start.  He will continue using topical agents.  He will follow up in 4 months.  High risk medication use - He started Otezla 30 mg p.o. twice daily in January 2020.  He has not noticed any improvement and will discontinue at this time.  We discussed switching to stelara.  He would like to read about Stelara and wait to start until after the coronavirus has calmed down.     Follow Up Instructions: He will follow up in 4 months.    I discussed the assessment and treatment plan with the patient. The patient was provided an opportunity to ask questions and all were answered. The patient agreed with the plan and demonstrated an understanding of the instructions.   The  patient was advised to call back or seek an in-person evaluation if the symptoms worsen or if the condition fails to improve as anticipated.  I provided 30 minutes of non-face-to-face time during this encounter.  Pollyann SavoyShaili Deveshwar MD  Scribed  by- Sherron Alesaylor Dale, PA-C

## 2018-06-14 ENCOUNTER — Encounter: Payer: Self-pay | Admitting: Rheumatology

## 2018-06-14 ENCOUNTER — Telehealth (INDEPENDENT_AMBULATORY_CARE_PROVIDER_SITE_OTHER): Payer: 59 | Admitting: Rheumatology

## 2018-06-14 DIAGNOSIS — Z79899 Other long term (current) drug therapy: Secondary | ICD-10-CM

## 2018-06-14 DIAGNOSIS — E038 Other specified hypothyroidism: Secondary | ICD-10-CM

## 2018-06-14 DIAGNOSIS — E039 Hypothyroidism, unspecified: Secondary | ICD-10-CM

## 2018-06-14 DIAGNOSIS — L400 Psoriasis vulgaris: Secondary | ICD-10-CM

## 2018-06-14 DIAGNOSIS — K760 Fatty (change of) liver, not elsewhere classified: Secondary | ICD-10-CM

## 2018-06-15 ENCOUNTER — Telehealth: Payer: Self-pay | Admitting: Rheumatology

## 2018-06-15 NOTE — Telephone Encounter (Signed)
Opened in error

## 2018-06-15 NOTE — Telephone Encounter (Signed)
-----   Message from Henriette Combs, LPN sent at 09/29/6551  2:08 PM EDT ----- Please schedule patient for follow up in 4 months. Patient was seen for telemedicine visit today 06/14/18. Thanks!

## 2018-08-27 ENCOUNTER — Ambulatory Visit: Payer: Self-pay | Admitting: Rheumatology

## 2018-09-21 NOTE — Progress Notes (Deleted)
Virtual Visit via Video Note  I connected with Burna Fortsan Swingle on 09/21/18 at 12:00 PM EDT by a video enabled telemedicine application and verified that I am speaking with the correct person using two identifiers.  Location: Patient: *** Provider: *** This service was conducted via virtual visit.  Both audio and visual tools were used.  The patient was located at home. I was located in my office.  Consent was obtained prior to the virtual visit and is aware of possible charges through their insurance for this visit.  The patient is an established patient.  Dr. Corliss Skainseveshwar, MD conducted the virtual visit and Sherron Alesaylor Dale, PA-C acted as scribe during the service.  Office staff helped with scheduling follow up visits after the service was conducted.   I discussed the limitations of evaluation and management by telemedicine and the availability of in person appointments. The patient expressed understanding and agreed to proceed.  History of Present Illness: Patient is a 48 year old male with a past medical history of plaque psoriasis.    Review of Systems  Constitutional: Negative for fever and malaise/fatigue.  Eyes: Negative for photophobia, pain, discharge and redness.  Respiratory: Negative for cough, shortness of breath and wheezing.   Cardiovascular: Negative for chest pain and palpitations.  Gastrointestinal: Negative for blood in stool, constipation and diarrhea.  Genitourinary: Negative for dysuria.  Musculoskeletal: Negative for back pain, joint pain, myalgias and neck pain.  Skin: Negative for rash.  Neurological: Negative for dizziness and headaches.  Psychiatric/Behavioral: Negative for depression. The patient is not nervous/anxious and does not have insomnia.       Observations/Objective: Physical Exam  Constitutional: He is oriented to person, place, and time and well-developed, well-nourished, and in no distress.  HENT:  Head: Normocephalic and atraumatic.  Eyes: Conjunctivae are  normal.  Pulmonary/Chest: Effort normal.  Neurological: He is alert and oriented to person, place, and time.  Psychiatric: Mood, memory, affect and judgment normal.   Patient reports morning stiffness for *** {minute/hour:19697}.   Patient {Actions; denies-reports:120008} nocturnal pain.  Difficulty dressing/grooming: {ACTIONS;DENIES/REPORTS:21021675::"Denies"} Difficulty climbing stairs: {ACTIONS;DENIES/REPORTS:21021675::"Denies"} Difficulty getting out of chair: {ACTIONS;DENIES/REPORTS:21021675::"Denies"} Difficulty using hands for taps, buttons, cutlery, and/or writing: {ACTIONS;DENIES/REPORTS:21021675::"Denies"}   Assessment and Plan: Visit Diagnoses:Plaque psoriasis- He has extensive psoriasis on his torso and upper and lower extremities.  He was started on [place] in January 2020.  He has been taking Otezla 30 mg BID.  He has not noticed any improvement since starting on Otezla.  He continues to use clobetasol and taclonex topically.  He will discontinue [place] due to not noticing any benefit.  We discussed the indications, contraindications, and possible side effects of stelara.  He is apprehensive to switch at this time due to the risk of infection/coronavirus pandemic.  He will read over information about Stelara and notify us when he is ready to start.  He will continue using topical agents.    High risk medication use - He started Otezla 30 mg p.o. twice daily in January 2020.    Follow Up Instructions: He will follow up in    I discussed the assessment and treatment plan with the patient. The patient was provided an opportunity to ask questions and all were answered. The patient agreed with the plan and demonstrated an understanding of the instructions.   The patient was advised to call back or seek an in-person evaluation if the symptoms worsen or if the condition fails to improve as anticipated.  I provided *** minutes of non-face-to-face time  during this  encounter.   Ofilia Neas, PA-C

## 2018-10-05 ENCOUNTER — Telehealth: Payer: 59 | Admitting: Rheumatology

## 2018-10-05 NOTE — Progress Notes (Signed)
Virtual Visit via Video Note  I connected with Edward Fitzgerald on 10/05/18 at  2:20 PM EDT by a video enabled telemedicine application and verified that I am speaking with the correct person using two identifiers.  Location: Patient: at home Provider: office This service was conducted via virtual visit.  Both audio and visual tools were used.  The patient was located at home. I was located in my office.  Consent was obtained prior to the virtual visit and is aware of possible charges through their insurance for this visit.  The patient is an established patient.  Dr. Corliss Skainseveshwar, MD conducted the virtual visit.  Office staff helped with scheduling follow up visits after the service was conducted.   I discussed the limitations of evaluation and management by telemedicine and the availability of in person appointments. The patient expressed understanding and agreed to proceed.  CC: plaque psoriasis.   History of Present Illness: Patient is a 48 year old male with a past medical history of plaque psoriasis. Patient has plaque psoriasis on his torso and bilateral upper and lower extremities.  Patient denies any joint pain.   Review of Systems  Constitutional: Negative for fever and malaise/fatigue.  Eyes: Negative for photophobia, pain, discharge and redness.  Respiratory: Negative for cough, shortness of breath and wheezing.   Cardiovascular: Negative for chest pain and palpitations.  Gastrointestinal: Negative for blood in stool, constipation and diarrhea.  Genitourinary: Negative for dysuria.  Musculoskeletal: Negative for back pain, joint pain, myalgias and neck pain.  Skin: Positive for rash.  Neurological: Negative for dizziness and headaches.  Psychiatric/Behavioral: Negative for depression. The patient is not nervous/anxious and does not have insomnia.       Observations/Objective:  Patient reports morning stiffness for 0 minutes.   Patient denies nocturnal pain.  Difficulty  dressing/grooming: Denies Difficulty climbing stairs: Denies Difficulty getting out of chair: Denies Difficulty using hands for taps, buttons, cutlery, and/or writing: Denies  Patient was oriented today time and space.  He showed me his extremities over the video which showed extensive plaque psoriasis on his bilateral lower extremities.  And some psoriasis on his upper extremities.  Assessment and Plan: Visit Diagnoses:Plaque psoriasis- He has extensive psoriasis on his torso and upper and lower extremities.  He was started on [place] in January 2020.  He has been taking Otezla 30 mg BID.  He has not noticed any improvement since starting on Otezla.  He discontinued [place] after taking it for 1 month. He continues to use taclonex topically.  He was very confused about the different topical agents he has.  We had detailed discussion and looked over different tubes back-and-forth several times..  Advised him to start on clobetasol and once he uses it for 6 weeks then he can switch to Taclonex.  Patient has been advised to restart otezla, along with applying clobetasol daily for 6 weeks.   High risk medication use -  Previously discussed switching to stelara as he thought [place] was not effective.  Although he did not take [place] for a longer duration.  Today after detailed discussion he was convinced to restart on [place]..  Patient has been advised to restart otezla 30mg  BID daily and apply clobetasol topically daily to affected area, for 6 weeks. Patient will come by the office on Monday to pick up an otezla start pack. Patient advised to take starter pack before resuming the regular dose.      Follow Up Instructions: He will follow up in 4 months.  I discussed the assessment and treatment plan with the patient. The patient was provided an opportunity to ask questions and all were answered. The patient agreed with the plan and demonstrated an understanding of the instructions.   The patient  was advised to call back or seek an in-person evaluation if the symptoms worsen or if the condition fails to improve as anticipated.  I provided 30 minutes of non-face-to-face time during this encounter.   Bo Merino, MD

## 2018-10-12 ENCOUNTER — Telehealth: Payer: Self-pay

## 2018-10-12 ENCOUNTER — Other Ambulatory Visit: Payer: Self-pay

## 2018-10-12 ENCOUNTER — Encounter: Payer: Self-pay | Admitting: Rheumatology

## 2018-10-12 ENCOUNTER — Telehealth (INDEPENDENT_AMBULATORY_CARE_PROVIDER_SITE_OTHER): Payer: 59 | Admitting: Rheumatology

## 2018-10-12 DIAGNOSIS — L400 Psoriasis vulgaris: Secondary | ICD-10-CM

## 2018-10-12 DIAGNOSIS — E038 Other specified hypothyroidism: Secondary | ICD-10-CM

## 2018-10-12 DIAGNOSIS — K760 Fatty (change of) liver, not elsewhere classified: Secondary | ICD-10-CM | POA: Diagnosis not present

## 2018-10-12 DIAGNOSIS — E039 Hypothyroidism, unspecified: Secondary | ICD-10-CM

## 2018-10-12 DIAGNOSIS — Z79899 Other long term (current) drug therapy: Secondary | ICD-10-CM

## 2018-10-12 NOTE — Telephone Encounter (Signed)
Patient had virtual visit today with Dr. Estanislado Pandy. Patient will be restarting otezla. Patient has been advised he has to restart with a starter pack.  Patient is to come by the office on Monday (10/15/2018) to pick up a starter pack to take before resuming the regular dosage.

## 2018-10-16 ENCOUNTER — Ambulatory Visit: Payer: Self-pay | Admitting: Rheumatology

## 2018-10-16 NOTE — Telephone Encounter (Signed)
Medication Samples have been provided to the patient.  Drug name: Rutherford Nail      Strength: started pack      Qty: 1  LOT: [id number]  Exp.Date: May 2021  Dosing instructions: Follow Titration   The patient has been instructed regarding the correct time, dose, and frequency of taking this medication, including desired effects and most common side effects.   Edward Fitzgerald 4:01 PM 10/16/2018

## 2018-10-16 NOTE — Telephone Encounter (Signed)
Reached out to patient and reminded patient to come by the office to pick up started pack of Otezla to restart.

## 2018-11-29 ENCOUNTER — Other Ambulatory Visit: Payer: Self-pay | Admitting: Rheumatology

## 2018-11-29 DIAGNOSIS — L400 Psoriasis vulgaris: Secondary | ICD-10-CM

## 2018-11-29 NOTE — Telephone Encounter (Signed)
Last Visit: 10/12/18 Next Visit: 02/12/19  Okay to refill per Dr. Estanislado Pandy

## 2018-12-14 ENCOUNTER — Telehealth: Payer: Self-pay | Admitting: *Deleted

## 2018-12-14 NOTE — Telephone Encounter (Signed)
Received fax from [facility] stating they have been trying to reach patient several times to refill Otezla. They have not been able to reach patient. Call back number [phone].

## 2019-02-12 ENCOUNTER — Ambulatory Visit: Payer: 59 | Admitting: Rheumatology

## 2019-03-08 ENCOUNTER — Telehealth: Payer: Self-pay | Admitting: Pharmacy Technician

## 2019-03-08 NOTE — Telephone Encounter (Signed)
Received notification from [facility] regarding a prior authorization for OTEZLA. Authorization has been APPROVED from 03/08/19 to 03/07/20.   Will send document to scan center.  Authorization # [id number] Phone # [phone]

## 2019-03-08 NOTE — Telephone Encounter (Signed)
Submitted a Prior Authorization request to [employer] for OTEZLA via Cover My Meds. Will update once we receive a response.   

## 2019-04-12 NOTE — Progress Notes (Deleted)
   Office Visit Note  Patient: Edward Fitzgerald             Date of Birth: 1970-06-25           MRN: [id number]             PCP: Georgianne Fick, MD Referring: Georgianne Fick, MD Visit Date: 04/18/2019 Occupation: @GUAROCC @  Subjective:  No chief complaint on file.   History of Present Illness: Edward Fitzgerald is a 49 y.o. male ***   Activities of Daily Living:  Patient reports morning stiffness for *** {minute/hour:19697}.   Patient {ACTIONS;DENIES/REPORTS:21021675::"Denies"} nocturnal pain.  Difficulty dressing/grooming: {ACTIONS;DENIES/REPORTS:21021675::"Denies"} Difficulty climbing stairs: {ACTIONS;DENIES/REPORTS:21021675::"Denies"} Difficulty getting out of chair: {ACTIONS;DENIES/REPORTS:21021675::"Denies"} Difficulty using hands for taps, buttons, cutlery, and/or writing: {ACTIONS;DENIES/REPORTS:21021675::"Denies"}  No Rheumatology ROS completed.   PMFS History:  Patient Active Problem List   Diagnosis Date Noted  . Subclinical hypothyroidism 02/08/2018  . NAFLD (nonalcoholic fatty liver disease) 14/01/2018  . Psoriasis 02/08/2018    No past medical history on file.  Family History  Problem Relation Age of Onset  . Healthy Mother   . Healthy Father   . Healthy Brother   . Healthy Son   . Healthy Daughter    Past Surgical History:  Procedure Laterality Date  . ABDOMINAL SURGERY     Social History   Social History Narrative  . Not on file    There is no immunization history on file for this patient.   Objective: Vital Signs: There were no vitals taken for this visit.   Physical Exam   Musculoskeletal Exam: ***  CDAI Exam: CDAI Score: -- Patient Global: --; Provider Global: -- Swollen: --; Tender: -- Joint Exam 04/18/2019   No joint exam has been documented for this visit   There is currently no information documented on the homunculus. Go to the Rheumatology activity and complete the homunculus joint exam.  Investigation: No additional  findings.  Imaging: No results found.  Recent Labs: Lab Results  Component Value Date   WBC 7.3 02/08/2018   HGB 16.7 02/08/2018   PLT 295 02/08/2018   NA 141 02/08/2018   K 4.1 02/08/2018   CL 104 02/08/2018   CO2 26 02/08/2018   GLUCOSE 93 02/08/2018   BUN 21 02/08/2018   CREATININE 1.03 02/08/2018   BILITOT 0.8 02/08/2018   ALKPHOS 105 02/22/2010   AST 24 02/08/2018   ALT 26 02/08/2018   PROT 8.0 02/08/2018   PROT 8.1 02/08/2018   ALBUMIN 4.7 02/22/2010   CALCIUM 10.2 02/08/2018   GFRAA 101 02/08/2018   QFTBGOLDPLUS NEGATIVE 02/08/2018    Speciality Comments: No specialty comments available.  Procedures:  No procedures performed Allergies: Patient has no known allergies.   Assessment / Plan:     Visit Diagnoses: No diagnosis found.  Orders: No orders of the defined types were placed in this encounter.  No orders of the defined types were placed in this encounter.   Face-to-face time spent with patient was *** minutes. Greater than 50% of time was spent in counseling and coordination of care.  Follow-Up Instructions: No follow-ups on file.   14/01/2018, PA-C  Note - This record has been created using Dragon software.  Chart creation errors have been sought, but may not always  have been located. Such creation errors do not reflect on  the standard of medical care.

## 2019-04-18 ENCOUNTER — Ambulatory Visit: Payer: 59 | Admitting: Rheumatology

## 2019-04-23 ENCOUNTER — Other Ambulatory Visit: Payer: Self-pay | Admitting: Rheumatology

## 2019-04-23 DIAGNOSIS — L400 Psoriasis vulgaris: Secondary | ICD-10-CM

## 2019-04-23 NOTE — Telephone Encounter (Signed)
Last Visit: 10/12/18 Next Visit: 05/23/19  Okay to refill per Dr. Corliss Skains

## 2019-05-16 NOTE — Progress Notes (Signed)
Virtual Visit via Video Note  I connected with Edward Fitzgerald on 05/23/19 at  3:45 PM EDT by a video enabled telemedicine application and verified that I am speaking with the correct person using two identifiers.  Location: Patient: Home  Provider: Clinic  This service was conducted via virtual visit.  Both audio and visual tools were used.  The patient was located at home. I was located in my office.  Consent was obtained prior to the virtual visit and is aware of possible charges through their insurance for this visit.  The patient is an established patient.  Dr. Estanislado Pandy, MD conducted the virtual visit and Hazel Sams, PA-C acted as scribe during the service.  Office staff helped with scheduling follow up visits after the service was conducted.    I discussed the limitations of evaluation and management by telemedicine and the availability of in person appointments. The patient expressed understanding and agreed to proceed.  CC: Psoriasis  History of Present Illness: Patient is a 49 year old male with a past medical history of plaque psoriasis.  He is taking otezla 30 mg 1 tablet by mouth twice daily.  He is tolerating otezla without any side effects. He uses taclonex ointment daily and clobetasol cream topically as needed. He states the psoriasis on his elbows has improved significantly. He has noticed some improvement in the clearance of psoriasis on his legs. He states the plaques on his back are about the same.  He denies any joint pain or joint swelling at this time. He denies any new concerns.   Review of Systems  Constitutional: Negative for fever and malaise/fatigue.  HENT: Negative for congestion.   Eyes: Negative for photophobia, pain, discharge and redness.  Respiratory: Negative for cough, shortness of breath and wheezing.   Cardiovascular: Negative for chest pain, palpitations and leg swelling.  Gastrointestinal: Negative for blood in stool, constipation and diarrhea.  Genitourinary:  Negative for dysuria and frequency.  Musculoskeletal: Negative for back pain, joint pain, myalgias and neck pain.  Skin: Positive for rash.  Neurological: Negative for dizziness, weakness and headaches.  Endo/Heme/Allergies: Does not bruise/bleed easily.  Psychiatric/Behavioral: Negative for depression and memory loss. The patient is not nervous/anxious and does not have insomnia.       Observations/Objective: Physical Exam  Constitutional: He is oriented to person, place, and time and well-developed, well-nourished, and in no distress.  HENT:  Head: Normocephalic and atraumatic.  Eyes: Conjunctivae are normal.  Pulmonary/Chest: Effort normal.  Neurological: He is alert and oriented to person, place, and time.  Psychiatric: Mood, memory, affect and judgment normal.   Patient reports morning stiffness for 0 NONE.   Patient denies nocturnal pain.  Difficulty dressing/grooming: Denies Difficulty climbing stairs: Denies Difficulty getting out of chair: Denies Difficulty using hands for taps, buttons, cutlery, and/or writing: Denies   Assessment and Plan: Visit Diagnoses:Plaque psoriasis-He has extensive psoriasis on his torso and upper and lower extremities. He was started on [place] in January 2020.  He is taking otezla 30 mg 1 tablet by mouth twice daily. He has noticed significant skin clearance on his upper extremities and mild improvement in plaques on his lower extremities.  His has not noticed any improvement on his torso.  He continues to use Taclonex ointment daily and uses clobetasol as needed.  A refill of clobetasol was sent to the pharmacy.  We discussed that clobetasol is a PRN agent and Taclonex is a maintenance agent.  He is not having any joint pain or  joint swelling at this time.  He will continue to take [facility] as prescribed.  He will follow up in 6 months.   High risk medication use -Otezla 30 mg 1 tablet by mouth twice daily. He does not want to be on more  aggressive treatment at this time.     Follow Up Instructions: He will follow up in 6 months    I discussed the assessment and treatment plan with the patient. The patient was provided an opportunity to ask questions and all were answered. The patient agreed with the plan and demonstrated an understanding of the instructions.   The patient was advised to call back or seek an in-person evaluation if the symptoms worsen or if the condition fails to improve as anticipated.  I provided 20 minutes of non-face-to-face time during this encounter.   Pollyann Savoy, MD   Scribed by-  Sherron Ales, PA-C

## 2019-05-23 ENCOUNTER — Encounter: Payer: Self-pay | Admitting: Rheumatology

## 2019-05-23 ENCOUNTER — Other Ambulatory Visit: Payer: Self-pay

## 2019-05-23 ENCOUNTER — Telehealth (INDEPENDENT_AMBULATORY_CARE_PROVIDER_SITE_OTHER): Payer: 59 | Admitting: Rheumatology

## 2019-05-23 DIAGNOSIS — K760 Fatty (change of) liver, not elsewhere classified: Secondary | ICD-10-CM

## 2019-05-23 DIAGNOSIS — Z79899 Other long term (current) drug therapy: Secondary | ICD-10-CM | POA: Diagnosis not present

## 2019-05-23 DIAGNOSIS — E038 Other specified hypothyroidism: Secondary | ICD-10-CM

## 2019-05-23 DIAGNOSIS — L400 Psoriasis vulgaris: Secondary | ICD-10-CM

## 2019-05-23 DIAGNOSIS — E039 Hypothyroidism, unspecified: Secondary | ICD-10-CM | POA: Diagnosis not present

## 2019-05-23 MED ORDER — CLOBETASOL PROPIONATE 0.05 % EX CREA: 1.0000 "application " | TOPICAL_CREAM | Freq: Two times a day (BID) | CUTANEOUS | 0 refills | Status: AC | PRN

## 2020-02-10 ENCOUNTER — Telehealth: Payer: Self-pay | Admitting: Pharmacy Technician

## 2020-02-10 NOTE — Telephone Encounter (Signed)
Received notification from [facility] regarding a prior authorization for OTEZLA. Authorization has been APPROVED from 02/29/20 to 02/27/21.   Authorization # [id number].

## 2021-01-13 ENCOUNTER — Telehealth: Payer: Self-pay

## 2021-01-13 NOTE — Telephone Encounter (Signed)
PA renewal automatically initiated by CMM.  Patient has seemingly not been seen by provider since 05/23/2019 Virtual OV.  Routing to [facility] for advisement.

## 2021-01-13 NOTE — Telephone Encounter (Signed)
I spoke with patient about scheduling an appointment. Per patient, he stopped taking [place] a long time ago because it did not seem to be helping. Patient saw doctor several times after that, and he did not think she had a different treatment plan other than Otezla. Patient does not feel like he needs to follow up at this time. Patient will call back if he needs anything in the furture.

## 2021-01-13 NOTE — Telephone Encounter (Signed)
Please advise patient to schedule an appointment as soon as possible
